# Patient Record
Sex: Male | Born: 1985 | Hispanic: No | Marital: Single | Smoking: Current some day smoker
Health system: Southern US, Community
[De-identification: ages and names within clinical notes are randomized; demographics above are authoritative.]

---
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
## 2016-04-16 ENCOUNTER — Encounter (HOSPITAL_COMMUNITY): Payer: Self-pay | Admitting: Emergency Medicine

## 2016-04-16 ENCOUNTER — Emergency Department (HOSPITAL_COMMUNITY)
Admission: EM | Admit: 2016-04-16 | Discharge: 2016-04-16 | Disposition: A | Discharge status: Home / Self Care | Payer: No Typology Code available for payment source | Attending: Emergency Medicine | Admitting: Emergency Medicine

## 2016-04-16 ENCOUNTER — Emergency Department (HOSPITAL_COMMUNITY): Payer: No Typology Code available for payment source

## 2016-04-16 DIAGNOSIS — Y939 Activity, unspecified: Secondary | ICD-10-CM | POA: Diagnosis not present

## 2016-04-16 DIAGNOSIS — Y999 Unspecified external cause status: Secondary | ICD-10-CM | POA: Insufficient documentation

## 2016-04-16 DIAGNOSIS — S39012A Strain of muscle, fascia and tendon of lower back, initial encounter: Secondary | ICD-10-CM | POA: Insufficient documentation

## 2016-04-16 DIAGNOSIS — Y92410 Unspecified street and highway as the place of occurrence of the external cause: Secondary | ICD-10-CM | POA: Diagnosis not present

## 2016-04-16 DIAGNOSIS — S161XXA Strain of muscle, fascia and tendon at neck level, initial encounter: Secondary | ICD-10-CM | POA: Insufficient documentation

## 2016-04-16 DIAGNOSIS — F17200 Nicotine dependence, unspecified, uncomplicated: Secondary | ICD-10-CM | POA: Diagnosis not present

## 2016-04-16 DIAGNOSIS — S199XXA Unspecified injury of neck, initial encounter: Secondary | ICD-10-CM | POA: Diagnosis present

## 2016-04-16 MED ORDER — METHOCARBAMOL 500 MG PO TABS: ORAL_TABLET | ORAL | 0 refills | Status: AC

## 2016-04-16 MED ORDER — IBUPROFEN 400 MG PO TABS
400.0000 mg | ORAL_TABLET | Freq: Once | ORAL | Status: AC
  Administered 2016-04-16: 400 mg via ORAL
  Filled 2016-04-16: qty 1

## 2016-04-16 MED ORDER — METHOCARBAMOL 500 MG PO TABS
500.0000 mg | ORAL_TABLET | Freq: Once | ORAL | Status: AC
  Administered 2016-04-16: 500 mg via ORAL
  Filled 2016-04-16: qty 1
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
## 2016-04-16 NOTE — ED Notes (Signed)
Pt transported to xray 
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
## 2016-04-16 NOTE — Discharge Instructions (Signed)
For pain control you may take up to 800mg of Motrin (also known as ibuprofen). That is usually 4 over the counter pills,  3 times a day. Take with food to minimize stomach irritation  ° °You can also take  tylenol (acetaminophen) 975mg (this is 3 over the counter pills) four times a day. Do not drink alcohol or combine with other medications that have acetaminophen as an ingredient (Read the labels!).   ° °For breakthrough pain you may take Robaxin. Do not drink alcohol, drive or operate heavy machinery when taking Robaxin. ° °Do not hesitate to return to the emergency room for any new, worsening or concerning symptoms. ° °Please obtain primary care using resource guide below. Let them know that you were seen in the emergency room and that they will need to obtain records for further outpatient management. ° ° °
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
## 2019-02-27 ENCOUNTER — Other Ambulatory Visit: Payer: Self-pay

## 2019-02-27 ENCOUNTER — Emergency Department (HOSPITAL_COMMUNITY)
Admission: EM | Admit: 2019-02-27 | Discharge: 2019-02-27 | Disposition: A | Discharge status: Home / Self Care | Payer: Self-pay | Attending: Emergency Medicine | Admitting: Emergency Medicine

## 2019-02-27 ENCOUNTER — Encounter (HOSPITAL_COMMUNITY): Payer: Self-pay

## 2019-02-27 DIAGNOSIS — Z5320 Procedure and treatment not carried out because of patient's decision for unspecified reasons: Secondary | ICD-10-CM | POA: Insufficient documentation

## 2019-02-27 DIAGNOSIS — N4833 Priapism, drug-induced: Secondary | ICD-10-CM | POA: Insufficient documentation

## 2019-02-27 DIAGNOSIS — R110 Nausea: Secondary | ICD-10-CM | POA: Insufficient documentation

## 2019-02-27 DIAGNOSIS — Z720 Tobacco use: Secondary | ICD-10-CM | POA: Insufficient documentation

## 2019-02-27 DIAGNOSIS — G4700 Insomnia, unspecified: Secondary | ICD-10-CM | POA: Insufficient documentation

## 2019-02-27 MED ORDER — PHENYLEPHRINE 200 MCG/ML FOR PRIAPISM / HYPOTENSION
100.0000 ug | Freq: Once | INTRAMUSCULAR | Status: DC
  Filled 2019-02-27: qty 50

## 2019-02-27 MED ORDER — LIDOCAINE HCL 2 % IJ SOLN
20.0000 mL | Freq: Once | INTRAMUSCULAR | Status: DC
  Filled 2019-02-27: qty 20

## 2019-02-27 NOTE — ED Notes (Signed)
Pt left without receiving discharge instructions. ?

## 2019-02-27 NOTE — ED Triage Notes (Signed)
Pt states he has had trouble sleeping at night so his friend gave him trazodone to take and ever since he has had an erection since 2am, pt tried ice and cols showers without relief.

## 2019-02-27 NOTE — ED Notes (Signed)
Consent signed for Aspiration of penis- per Dr. Ronnald Nian.

## 2019-02-27 NOTE — ED Provider Notes (Signed)
Medical screening examination/treatment/procedure(s) were conducted as a shared visit with non-physician practitioner(s) and myself.  I personally evaluated the patient during the encounter. Briefly, the patient is a 33 y.o. male with no significant medical history presents the ED with priapism.  Patient took a dose of trazodone last night for insomnia.  Has had erection for 12 hours.  Successfully underwent phenylephrine injection.  Tolerated very well.  Erection went away and patient was able to urinate without any issues.  Educated about trazodone and recommend that he no longer take this medication.  Given information to follow-up with mental health provider about insomnia.   EKG Interpretation None        .Nerve Block  Date/Time: 02/27/2019 3:21 PM Performed by: Lennice Sites, DO Authorized by: Lennice Sites, DO   Consent:    Consent obtained:  Written and verbal   Consent given by:  Patient   Risks discussed:  Allergic reaction, bleeding, intravenous injection, infection, nerve damage, swelling, unsuccessful block and pain   Alternatives discussed:  No treatment Indications:    Indications:  Pain relief Location:    Nerve block body site: penis, dorsal penile nerve block.   Laterality:  Bilateral Pre-procedure details:    Skin preparation:  2% chlorhexidine   Preparation: Patient was prepped and draped in usual sterile fashion   Skin anesthesia (see MAR for exact dosages):    Skin anesthesia method:  Local infiltration   Local anesthetic:  Lidocaine 1% w/o epi Procedure details (see MAR for exact dosages):    Block needle gauge:  18 G   Injection procedure:  Anatomic landmarks identified, incremental injection, negative aspiration for blood, anatomic landmarks palpated and introduced needle   Paresthesia:  Immediately resolved Post-procedure details:    Dressing:  None   Outcome:  Anesthesia achieved   Patient tolerance of procedure:  Tolerated well, no immediate  complications       Lennice Sites, DO 02/27/19 1523

## 2019-02-27 NOTE — ED Provider Notes (Signed)
[facility] EMERGENC[facility] DEPARTMENT Provider Note   CSN: [id number] Arrival date & time: 02/27/19  1236     History   Chief Complaint Chief Complaint  Patient presents with  . Priapism  . Insomnia    HPI Daniel Hunt is a 33 y.o. male presents for evaluation of acute onset, persistent erection for 12 hours.  He reports that he has been having some difficulty sleeping and a friend gave him a tablet of trazodone to take which he took for the first time last night.  He reports that at around 2 AM he developed an erection which has persisted.  He notes constant pain and to the base of the shaft and some mild nausea but no vomiting or abdominal pain.  He has applied ice and taken a cold shower without relief of symptoms.  Denies testicular pain or scrotal swelling.  He has had difficulty urinating since priapism began.     The history is provided by the patient.    History reviewed. No pertinent past medical history.  There are no active problems to display for this patient.   History reviewed. No pertinent surgical history.      Home Medications    Prior to Admission medications   Medication Sig Start Date End Date Taking? Authorizing Provider  methocarbamol (ROBAXIN) 500 MG tablet Can take up to 1-2 tabs every 6 hours PRN PAIN 04/16/16   Pisciotta, Elmyra Ricks, PA-C    Family History No family history on file.  Social History Social History   Tobacco Use  . Smoking status: Current Some Day Smoker    Packs/day: 0.26  . Smokeless tobacco: Never Used  Substance Use Topics  . Alcohol use: No  . Drug use: No     Allergies   Trazodone and nefazodone   Review of Systems Review of Systems  Constitutional: Negative for chills and fever.  Respiratory: Negative for shortness of breath.   Cardiovascular: Negative for chest pain.  Gastrointestinal: Positive for nausea. Negative for abdominal pain, diarrhea and vomiting.  Genitourinary: Positive for  penile pain. Negative for discharge, penile swelling, scrotal swelling and testicular pain.  All other systems reviewed and are negative.    Physical Exam Updated Vital Signs BP 125/85 (BP Location: Right Arm)   Pulse 70   Temp 98.6 F (37 C) (Oral)   Resp 16   SpO2 99%   Physical Exam Vitals signs and nursing note reviewed.  Constitutional:      General: He is not in acute distress.    Appearance: He is well-developed.  HENT:     Head: Normocephalic and atraumatic.  Eyes:     General:        Right eye: No discharge.        Left eye: No discharge.     Conjunctiva/sclera: Conjunctivae normal.  Neck:     Vascular: No JVD.     Trachea: No tracheal deviation.  Cardiovascular:     Rate and Rhythm: Normal rate.  Pulmonary:     Effort: Pulmonary effort is normal.  Abdominal:     General: Bowel sounds are normal. There is no distension.     Palpations: Abdomen is soft.     Tenderness: There is no abdominal tenderness. There is no guarding or rebound.  Genitourinary:    Penis: Swelling present. No erythema or discharge.      Epididymis:     Right: Normal. No tenderness.     Left: Normal. No tenderness.  Comments: Penis is erect, no necrotic lesions or duskiness noted.  Skin:    General: Skin is warm and dry.     Findings: No erythema.  Neurological:     Mental Status: He is alert.  Psychiatric:        Behavior: Behavior normal.      ED Treatments / Results  Labs (all labs ordered are listed, but only abnormal results are displayed) Labs Reviewed - No data to display  EKG None  Radiology No results found.  Procedures Irrigate corpus cavern, priapism  Date/Time: 02/27/2019 4:28 PM Performed by: Jeanie SewerFawze, Samuell Knoble A, PA-C Authorized by: Jeanie SewerFawze, Millissa Deese A, PA-C  Consent: Written consent obtained. Risks and benefits: risks, benefits and alternatives were discussed Consent given by: patient Patient understanding: patient states understanding of the procedure being  performed Patient consent: the patient's understanding of the procedure matches consent given Procedure consent: procedure consent matches procedure scheduled Relevant documents: relevant documents present and verified Test results: test results available and properly labeled Site marked: the operative site was marked Imaging studies: imaging studies available Required items: required blood products, implants, devices, and special equipment available Patient identity confirmed: verbally with patient and arm band Time out: Immediately prior to procedure a "time out" was called to verify the correct patient, procedure, equipment, support staff and site/side marked as required. Preparation: Patient was prepped and draped in the usual sterile fashion. Local anesthesia used: yes Anesthesia: nerve block  Anesthesia: Local anesthesia used: yes Local Anesthetic: lidocaine 2% without epinephrine Anesthetic total: 6 mL  Sedation: Patient sedated: no  Patient tolerance: patient tolerated the procedure well with no immediate complications Comments: Pudendal nerve block attempted with partial anesthesia achieved.  600 mcg phenylephrine injected total with reduction of priapism.  Patient did sustain a small hematoma to the right dorsum of the penis but bleeding/swelling are controlled.  Procedure performed under the supervision of Dr. Lockie Molauratolo.    (including critical care time)  Medications Ordered in ED Medications  phenylephrine 200 mcg / ml CONC. DILUTION INJ (ED / Urology USE ONLY) (has no administration in time range)  lidocaine (XYLOCAINE) 2 % (with pres) injection 400 mg (has no administration in time range)     Initial Impression / Assessment and Plan / ED Course  I have reviewed the triage vital signs and the nursing notes.  Pertinent labs & imaging results that were available during my care of the patient were reviewed by me and considered in my medical decision making (see chart  for details).        Patient presents for evaluation of priapism, likely trazodone induced.  He is afebrile, vital signs are stable.  He is nontoxic in appearance.  Abdomen soft and nontender.  No evidence of necrosis or other ischemic sequela on examination of the penis.  He consented to injection of phenylephrine to help reduce the priapism.  Dental nerve block attempted with partial anesthesia achieved.  600 mcg of phenylephrine were injected in total with successful reduction of the priapism.  He did sustain a small hematoma to the dorsum of the penis but no evidence of active bleeding.  He was given a urinal and instructed to provide urine sample to ensure that he could urinate without difficulty prior to discharge.  3:55 PM I went back into the patient's room to reassess him and make sure that he could provide a urine sample.  He was no longer in the room and his belongings had also been removed.  There was  some urine in a urinal at the bedside but I assume the patient eloped prior to reevaluation and further recommendations.  We did discuss sleep hygiene and follow-up with behavioral health services for reevaluation and potential medication management prior to his elopement.  Final Clinical Impressions(s) / ED Diagnoses   Final diagnoses:  Priapism, drug-induced  Insomnia, unspecified type    ED Discharge Orders    None       Jeanie Sewer, PA-C 02/27/19 1633    Virgina Norfolk, DO 02/28/19 [phone]

## 2019-02-27 NOTE — Discharge Instructions (Signed)
You can take 1 to 2 tablets of Tylenol (350mg -1000mg  depending on the dose) every 6 hours as needed for pain.  Do not exceed 4000 mg of Tylenol daily.  If your pain persists you can take a doses of ibuprofen in between doses of Tylenol.  I usually recommend 400 to 600 mg of ibuprofen every 6 hours.  Take this with food to avoid upset stomach issues.   Can apply ice for swelling/bruising. Bruising is not uncommon after this procedure and will resolve over time.   Follow up with behavioral health services provided here for re-evaluation of your insomnia and further recommendations.   Return to the emergency department if any concerning signs or symptoms develop.

## 2023-04-28 ENCOUNTER — Other Ambulatory Visit (HOSPITAL_COMMUNITY): Payer: Self-pay

## 2023-04-28 MED ORDER — FINASTERIDE 1 MG PO TABS
1.0000 mg | ORAL_TABLET | Freq: Every day | ORAL | 3 refills | Status: AC
  Filled 2023-04-28: qty 90, 90d supply, fill #0
  Filled 2023-06-08 (×2): qty 90, 90d supply, fill #1
  Filled 2023-06-22: qty 90, 90d supply, fill #2
  Filled 2023-07-26 – 2023-08-05 (×2): qty 90, 90d supply, fill #3

## 2023-06-08 ENCOUNTER — Other Ambulatory Visit (HOSPITAL_COMMUNITY): Payer: Self-pay

## 2023-06-22 ENCOUNTER — Other Ambulatory Visit (HOSPITAL_COMMUNITY): Payer: Self-pay

## 2023-06-24 ENCOUNTER — Other Ambulatory Visit (HOSPITAL_COMMUNITY): Payer: Self-pay

## 2023-07-26 ENCOUNTER — Other Ambulatory Visit (HOSPITAL_COMMUNITY): Payer: Self-pay

## 2023-08-04 ENCOUNTER — Other Ambulatory Visit (HOSPITAL_COMMUNITY): Payer: Self-pay

## 2023-08-05 ENCOUNTER — Other Ambulatory Visit (HOSPITAL_COMMUNITY): Payer: Self-pay

## 2023-08-11 ENCOUNTER — Other Ambulatory Visit (HOSPITAL_COMMUNITY): Payer: Self-pay

## 2023-10-10 ENCOUNTER — Other Ambulatory Visit (HOSPITAL_COMMUNITY): Payer: Self-pay

## 2023-10-17 ENCOUNTER — Other Ambulatory Visit (HOSPITAL_COMMUNITY): Payer: Self-pay
